# Patient Record
Sex: Female | Born: 2005 | Race: Black or African American | Hispanic: No | Marital: Single | Smoking: Never smoker
Health system: Southern US, Community
[De-identification: ages and names within clinical notes are randomized; demographics above are authoritative.]

## PROBLEM LIST (undated history)

## (undated) DIAGNOSIS — I10 Essential (primary) hypertension: Secondary | ICD-10-CM

## (undated) HISTORY — DX: Essential (primary) hypertension: I10

---
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
## 2023-11-23 ENCOUNTER — Ambulatory Visit (INDEPENDENT_AMBULATORY_CARE_PROVIDER_SITE_OTHER): Payer: Self-pay | Admitting: Family

## 2023-11-23 ENCOUNTER — Encounter (HOSPITAL_BASED_OUTPATIENT_CLINIC_OR_DEPARTMENT_OTHER): Payer: Self-pay | Admitting: Family

## 2023-11-23 VITALS — BP 136/80 | HR 101 | Ht 67.0 in | Wt 257.4 lb

## 2023-11-23 DIAGNOSIS — R0683 Snoring: Secondary | ICD-10-CM | POA: Diagnosis not present

## 2023-11-23 DIAGNOSIS — I10 Essential (primary) hypertension: Secondary | ICD-10-CM

## 2023-11-23 DIAGNOSIS — R400 Somnolence: Secondary | ICD-10-CM

## 2023-11-23 MED ORDER — NAPROXEN 500 MG PO TABS: 500.0000 mg | ORAL_TABLET | ORAL | 0 refills | Status: AC | PRN

## 2023-11-23 NOTE — Progress Notes (Signed)
 [facility] Initial Assessment:    Date:  11/23/2023   ID:  Tara Moss, DOB 28-Jan-2006, MRN [id number]  PCP:  Regino Slater, MD  Cardiologist:  None  Nephrologist:  Referring MD: Rutherford Gain, MD   CC: Hypertension  History of Present Illness:    Tara Moss is a 18 y.o. female with a hx of hypertension here to establish care in the [facility].   Tara Moss was diagnosed with hypertension at recent visit with her OBGYN, Dr. Rutherford. 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 No prior elevated BP.  Blood pressure not checked routinely at home. she reports tobacco use never. Alcohol use never. For exercise she has started walking.. she eats at home and outside of the home and does follow low sodium diet at home though notes likely gets higher sodium content when eating out   Reports no shortness of breath nor dyspnea on exertion. Reports no chest pain, pressure, or tightness. No edema, orthopnea, PND. Reports no palpitations.  She does note snoring and daytime somnolence.   Previous antihypertensives:    Past Medical History:  Diagnosis Date   Hypertension     History reviewed. No pertinent surgical history.  Current Medications: Current Meds  Medication Sig   acetaminophen-codeine (TYLENOL #3) 300-30 MG tablet Take 1-2 tablets by mouth every 6 (six) hours as needed for moderate pain.   alum & mag hydroxide-simeth (MAALOX MAX) 400-400-40 MG/5ML suspension Take 15 mLs by mouth every 6 (six) hours as needed for indigestion.   Misc Natural Products (AIRBORNE ELDERBERRY) CHEW Chew 1 tablet by mouth daily as needed (immune efficiency).   Multiple Vitamin (MULTIVITAMIN) capsule Take 1 capsule by mouth daily.   naproxen (NAPROSYN) 500 MG tablet Take 1 tablet (500 mg total) by mouth as needed.   [DISCONTINUED] naproxen (NAPROSYN) 500 MG tablet Take 1 tablet (500 mg total) by mouth 2 (two)  times daily.     Allergies:   Patient has no known allergies.   Social History   Socioeconomic History   Marital status: Single    Spouse name: Not on file   Number of children: Not on file   Years of education: Not on file   Highest education level: Not on file  Occupational History   Not on file  Tobacco Use   Smoking status: Never   Smokeless tobacco: Never  Substance and Sexual Activity   Alcohol use: No   Drug use: Not on file   Sexual activity: Not on file  Other Topics Concern   Not on file  Social History Narrative   Not on file   Social Drivers of Health   Financial Resource Strain: Low Risk  (11/23/2023)   Overall Financial Resource Strain (CARDIA)    Difficulty of Paying Living Expenses: Not hard at all  Food Insecurity: No Food Insecurity (11/23/2023)   Hunger Vital Sign    Worried About Running Out of Food in the Last Year: Never true    Ran Out of Food in the Last Year: Never true  Transportation Needs: No Transportation Needs (11/23/2023)   PRAPARE - Administrator, Civil Service (Medical): No    Lack of Transportation (Non-Medical): No  Physical Activity: Insufficiently Active (11/23/2023)   Exercise Vital Sign    Days of Exercise per Week: 2 days    Minutes of Exercise per Session: 30 min  Stress: No Stress Concern Present (11/23/2023)  [employer] of Occupational Health - Occupational Stress Questionnaire    Feeling of Stress: Only a little  Social Connections: Moderately Integrated (11/23/2023)   Social Connection and Isolation Panel    Frequency of Communication with Friends and Family: More than three times a week    Frequency of Social Gatherings with Friends and Family: More than three times a week    Attends Religious Services: More than 4 times per year    Active Member of [employer] or Organizations: Yes    Attends Engineer, structural: More than 4 times per year    Marital Status: Never married     Family  History: The patient's family history includes Heart attack in her paternal grandmother; Hypertension in her mother and sister.  ROS:   Please see the history of present illness.     All other systems reviewed and are negative.  EKGs/Labs/Other Studies Reviewed:    EKG Interpretation Date/Time:  Thursday November 23 2023 15:23:58 EDT Ventricular Rate:  101 PR Interval:  146 QRS Duration:  80 QT Interval:  344 QTC Calculation: 446 R Axis:   34  Text Interpretation: Sinus tachycardia No previous ECGs available Confirmed by Vannie Mora ([id number]) on 11/23/2023 3:25:21 PM    Recent Labs: No results found for requested labs within last 365 days.   Recent Lipid Panel No results found for: CHOL, TRIG, HDL, CHOLHDL, VLDL, LDLCALC, LDLDIRECT  Physical Exam:   VS:  BP 136/80   Pulse (!) 101   Ht 5' 7 (1.702 m)   Wt 257 lb 6 oz (116.7 kg)   SpO2 99%   BMI 40.31 kg/m  , BMI Body mass index is 40.31 kg/m.  Vitals:   11/23/23 1520 11/23/23 1530 11/23/23 1554  BP: (!) 130/92 132/84 136/80  Pulse: (!) 101    Height: 5' 7 (1.702 m)    Weight: 257 lb 6 oz (116.7 kg)    SpO2: 99%    BMI (Calculated): 40.3      GENERAL:  Well appearing, overweight HEENT: Pupils equal round and reactive, fundi not visualized, oral mucosa unremarkable NECK:  No jugular venous distention, waveform within normal limits, carotid upstroke brisk and symmetric, no bruits, no thyromegaly LYMPHATICS:  No cervical adenopathy LUNGS:  Clear to auscultation bilaterally HEART:  RRR.  PMI not displaced or sustained,S1 and S2 within normal limits, no S3, no S4, no clicks, no rubs, no murmurs ABD:  Flat, positive bowel sounds normal in frequency in pitch, no bruits, no rebound, no guarding, no midline pulsatile mass, no hepatomegaly, no splenomegaly EXT:  2 plus pulses throughout, no edema, no cyanosis no clubbing SKIN:  No rashes no nodules NEURO:  Cranial nerves II through XII grossly intact, motor  grossly intact throughout PSYCH:  Cognitively intact, oriented to person place and time   ASSESSMENT/PLAN:    HTN - New diagnosis. Family history of hypertension in her mother and sister.  initial BP in clinic 130/92 and 132/84. On repeat BP 136/80. Discussed low dose Amlodipine vs lifestyle changes. Plan to initiate lifestyle changes detailed below. Virtual visit in 4-6 weeks and if BP not at [place], plan to utilize Amlodipine.  Discussed to monitor BP at home at least 2 hours after medications and sitting for 5-10 minutes.  Recommend aiming for 150 minutes of moderate intensity activity per week and following a heart healthy diet.  Discussed tactics to lower sodium intake when eating out. Encouraged to reduce caffeine intake.   Snores / Sleep disordered  breathing - STOP Bang 4. Plan for Itamar home sleep study.   BMI 40 - Weight loss via diet and exercise encouraged. Discussed the impact being overweight would have on cardiovascular risk.   Sinus tachycardia - noted by EKG today HR 101 bpm. Denies palpitations. Encouraged reduction in caffeine intake.  Screening for Secondary Hypertension:     Relevant Labs/Studies:    Latest Ref Rng & Units 02/19/2022    9:21 PM 02/18/2022   10:24 AM  Basic Labs  Sodium 135 - 145 mmol/L 133  138   Potassium 3.5 - 5.1 mmol/L 3.7  4.1   Creatinine 0.44 - 1.00 mg/dL 9.27  9.22                      Disposition:    FU with MD/APP/PharmD in 4-6 weeks    Medication Adjustments/Labs and Tests Ordered: Current medicines are reviewed at length with the patient today.  Concerns regarding medicines are outlined above.  Orders Placed This Encounter  Procedures   EKG 12-Lead   Itamar Sleep Study   Meds ordered this encounter  Medications   naproxen  (NAPROSYN ) 500 MG tablet    Sig: Take 1 tablet (500 mg total) by mouth as needed.    Dispense:  30 tablet    Refill:  0    Supervising Provider:   LONNI SLAIN [[id number]]      Signed, Reche GORMAN Finder, NP  11/23/2023 7:15 PM    [facility]

## 2023-11-23 NOTE — Patient Instructions (Addendum)
 Medication Instructions:   Your physician recommends that you continue on your current medications as directed. Please refer to the Current Medication list given to you today.     Testing/Procedures:  Your physician has recommended that you have a ITAMAR sleep study. This test records several body functions during sleep, including: brain activity, eye movement, oxygen and carbon dioxide blood levels, heart rate and rhythm, breathing rate and rhythm, the flow of air through your mouth and nose, snoring, body muscle movements, and chest and belly movement.  THIS DEVICE WILL BE MAILED TO YOU BY THE COMPANY ONCE PRE-CERTIFICATION IS COMPLETE--THEY WILL MAIL THIS TO YOU AND PROVIDE YOU WITH THE PIN NUMBER    Follow-Up: Please follow up in 4-6 WEEKS AS A VIRTUAL VISIT  in [facility] with Reche Finder, NP     Special Instructions:    Recommend upper arm automatic BP cuff - Omron is a good brand  Tips to Measure your Blood Pressure Correctly  Here's what you can do to ensure a correct reading:  Don't drink a caffeinated beverage or smoke during the 30 minutes before the test.  Sit quietly for five minutes before the test begins.  During the measurement, sit in a chair with your feet on the floor and your arm supported so your elbow is at about heart level.  The inflatable part of the cuff should completely cover at least 80% of your upper arm, and the cuff should be placed on bare skin, not over a shirt.  Don't talk during the measurement.  Have your blood pressure measured twice, with a brief break in between. If the readings are different by 5 points or more, have it done a third time.  In 2017, new guidelines from the American Heart Association, the [employer] of Cardiology, and nine other health organizations lowered the diagnosis of high blood pressure to 130/80 mm Hg or higher for all adults. The guidelines also redefined the various blood pressure categories to now include  normal, elevated, Stage 1 hypertension, Stage 2 hypertension, and hypertensive crisis (see Blood pressure categories).  Blood pressure categories  Blood pressure category SYSTOLIC (upper number)  DIASTOLIC (lower number)  Normal Less than 120 mm Hg and Less than 80 mm Hg  Elevated 120-129 mm Hg and Less than 80 mm Hg  High blood pressure: Stage 1 hypertension 130-139 mm Hg or 80-89 mm Hg  High blood pressure: Stage 2 hypertension 140 mm Hg or higher or 90 mm Hg or higher  Hypertensive crisis (consult your doctor immediately) Higher than 180 mm Hg and/or Higher than 120 mm Hg  Source: American Heart Association and American Stroke Association. For more on getting your blood pressure under control, buy Controlling Your Blood Pressure, a Special Health Report from [facility].

## 2023-12-06 ENCOUNTER — Telehealth: Payer: Self-pay | Admitting: *Deleted

## 2023-12-06 NOTE — Telephone Encounter (Signed)
-----   Message from [facility] Dalton J sent at 12/05/2023  1:28 PM EDT ----- Regarding: RE: VERNEDA SLEEP STUDY PER CAITLIN WALKER NP This one too. The company will contact them. ----- Message ----- From: Gladis Porter HERO, LPN Sent: [phone]/85/7974   4:28 PM EDT To: Dalton KANDICE Molt, CMA; Avelina HERO Via, LPN; # Subject: VERNEDA SLEEP STUDY PER CAITLIN WALKER NP       Reche Finder NP saw this pt in clinic today and ordered an ITAMAR sleep study on her  STOP BANG was 4  Order is in  Please make arrangements to mail to the pt   Thanks, Porter

## 2024-01-04 ENCOUNTER — Telehealth (HOSPITAL_BASED_OUTPATIENT_CLINIC_OR_DEPARTMENT_OTHER): Admitting: Family

## 2024-01-26 ENCOUNTER — Telehealth: Payer: Self-pay

## 2024-01-26 NOTE — Telephone Encounter (Signed)
 Ordering provider: Reche Finder, NP Associated diagnoses: Hypertension, unspecified type [I10]; Snores [R06.83]; Daytime somnolence [R40.0]     3. Patient NOT notified of PIN (1234) on 01/26/2024   Phone note routed to covering staff for follow-up.

## 2024-01-29 NOTE — Telephone Encounter (Addendum)
**Note De-Identified Kiamesha Samet Obfuscation** Patient agreement reviewed and signed on 01/29/2024 by the pts Mother and DPR, Jocelyn Beacoats .  WatchPAT issued to the patient's mother on 01/29/2024 by Pius Byrom, Avelina HERO, LPN. Patient aware to not open the WatchPAT box until contacted with the activation PIN. Patient profile initialized in CloudPAT on 01/29/2024 by Avelina BATTLE Pat Elicker, LPN. Device serial number: [id number]

## 2024-02-02 ENCOUNTER — Encounter (HOSPITAL_BASED_OUTPATIENT_CLINIC_OR_DEPARTMENT_OTHER): Payer: Self-pay | Admitting: Cardiology

## 2024-02-02 DIAGNOSIS — R0683 Snoring: Secondary | ICD-10-CM

## 2024-02-08 ENCOUNTER — Ambulatory Visit: Payer: Self-pay | Attending: Family

## 2024-02-08 DIAGNOSIS — R400 Somnolence: Secondary | ICD-10-CM

## 2024-02-08 DIAGNOSIS — I10 Essential (primary) hypertension: Secondary | ICD-10-CM

## 2024-02-08 DIAGNOSIS — R0683 Snoring: Secondary | ICD-10-CM

## 2024-02-08 NOTE — Procedures (Signed)
   SLEEP STUDY REPORT Patient Information Study Date: 02/02/2024 Patient Name: Tara Moss Patient ID: [id number] Birth Date: [birth year] Age: 18 Gender:Female BMI: 40.5 (W=258 lb, H=5' 7'') Stop Bang: 4 Referring Physician: Reche Finder NP  TEST DESCRIPTION: Home sleep apnea testing was completed using the WatchPat, a Type 1 device, utilizing peripheral arterial tonometry (PAT), chest movement, actigraphy, pulse oximetry, pulse rate, body position and snore. AHI was calculated with apnea and hypopnea using valid sleep time as the denominator. RDI includes apneas, hypopneas, and RERAs. The data acquired and the scoring of sleep and all associated events were performed in accordance with the recommended standards and specifications as outlined in the AASM Manual for the Scoring of Sleep and Associated Events 2.2.0 (2015).  FINDINGS: 1. No evidence of Obstructive Sleep Apnea with AHI 0.7/hr. 2. No Central Sleep Apnea. 3. Oxygen desaturations as low as 91%. 4. Moderate snoring was present. O2 sats were < 88% for 0 minutes. 5. Total sleep time was 7 hrs and . 6. 24.9% of total sleep time was spent in REM sleep. 7. Normal sleep onset latency at 10 min. 8. Shortened REM sleep onset latency at 71 min. 9. Total awakenings were 11.  DIAGNOSIS: Normal study with no significant sleep disordered breathing.  RECOMMENDATIONS: 1. Normal study with no significant sleep disordered breathing. 2. Healthy sleep recommendations include: adequate nightly sleep (normal 7-9 hrs/night), avoidance of caffeine after noon and alcohol near bedtime, and maintaining a sleep environment that is cool, dark and quiet. 3. Weight loss for overweight patients is recommended. 4. Snoring recommendations include: weight loss where appropriate, side sleeping, and avoidance of alcohol before bed. 5. Operation of motor vehicle or dangerous equipment must be avoided when feeling drowsy, excessively sleepy,  or mentally fatigued. 6. An ENT consultation which may be useful for specific causes of and possible treatment of bothersome snoring . 7. Weight loss may be of benefit in reducing the severity of snoring.   Signature: Wilbert Bihari, MD; [facility]; Diplomat, American Board of Sleep Medicine Electronically Signed: 02/08/2024 9:27:25 PM

## 2024-02-12 NOTE — Telephone Encounter (Signed)
 Noted in pts chart that she did complete ITAMAR sleep study and results have been reviewed by Dr. Shlomo.   Will close this encounter and sent this back to our sleep pool for further management and to make them of Itamar completion.

## 2024-02-27 ENCOUNTER — Telehealth: Payer: Self-pay | Admitting: *Deleted

## 2024-02-27 NOTE — Telephone Encounter (Signed)
-----   Message from Wilbert Bihari sent at 02/08/2024  9:28 PM EDT ----- Please let patient know that sleep study showed no significant sleep apnea.
